# Patient Record
Sex: Male | Born: 1940 | Race: White | Hispanic: No | Smoking: Never smoker
Health system: Southern US, Community
[De-identification: ages and names within clinical notes are randomized; demographics above are authoritative.]

## PROBLEM LIST (undated history)

## (undated) DIAGNOSIS — I712 Thoracic aortic aneurysm, without rupture, unspecified: Secondary | ICD-10-CM

## (undated) DIAGNOSIS — E119 Type 2 diabetes mellitus without complications: Secondary | ICD-10-CM

## (undated) DIAGNOSIS — I493 Ventricular premature depolarization: Secondary | ICD-10-CM

## (undated) DIAGNOSIS — I739 Peripheral vascular disease, unspecified: Secondary | ICD-10-CM

## (undated) DIAGNOSIS — I2510 Atherosclerotic heart disease of native coronary artery without angina pectoris: Secondary | ICD-10-CM

## (undated) DIAGNOSIS — R600 Localized edema: Secondary | ICD-10-CM

## (undated) DIAGNOSIS — I10 Essential (primary) hypertension: Secondary | ICD-10-CM

## (undated) DIAGNOSIS — E785 Hyperlipidemia, unspecified: Secondary | ICD-10-CM

---

## 2000-12-08 HISTORY — PX: CARDIAC CATHETERIZATION: SHX172

## 2015-12-09 HISTORY — PX: CARDIAC CATHETERIZATION: SHX172

## 2021-03-09 ENCOUNTER — Observation Stay (HOSPITAL_COMMUNITY)
Admission: EM | Admit: 2021-03-09 | Discharge: 2021-03-10 | Disposition: A | Discharge status: Home / Self Care | Payer: Medicare HMO | Attending: Internal Medicine | Admitting: Internal Medicine

## 2021-03-09 ENCOUNTER — Encounter (HOSPITAL_COMMUNITY): Payer: Self-pay | Admitting: Physician Assistant

## 2021-03-09 ENCOUNTER — Emergency Department (HOSPITAL_COMMUNITY): Payer: Medicare HMO

## 2021-03-09 ENCOUNTER — Other Ambulatory Visit: Payer: Self-pay

## 2021-03-09 DIAGNOSIS — I2510 Atherosclerotic heart disease of native coronary artery without angina pectoris: Secondary | ICD-10-CM | POA: Diagnosis not present

## 2021-03-09 DIAGNOSIS — R6884 Jaw pain: Secondary | ICD-10-CM | POA: Insufficient documentation

## 2021-03-09 DIAGNOSIS — Z8673 Personal history of transient ischemic attack (TIA), and cerebral infarction without residual deficits: Secondary | ICD-10-CM | POA: Diagnosis not present

## 2021-03-09 DIAGNOSIS — E119 Type 2 diabetes mellitus without complications: Secondary | ICD-10-CM | POA: Insufficient documentation

## 2021-03-09 DIAGNOSIS — I10 Essential (primary) hypertension: Secondary | ICD-10-CM | POA: Insufficient documentation

## 2021-03-09 DIAGNOSIS — I739 Peripheral vascular disease, unspecified: Secondary | ICD-10-CM | POA: Diagnosis not present

## 2021-03-09 DIAGNOSIS — R42 Dizziness and giddiness: Secondary | ICD-10-CM | POA: Diagnosis not present

## 2021-03-09 DIAGNOSIS — R55 Syncope and collapse: Secondary | ICD-10-CM | POA: Insufficient documentation

## 2021-03-09 DIAGNOSIS — I959 Hypotension, unspecified: Secondary | ICD-10-CM | POA: Diagnosis not present

## 2021-03-09 DIAGNOSIS — I493 Ventricular premature depolarization: Secondary | ICD-10-CM | POA: Insufficient documentation

## 2021-03-09 DIAGNOSIS — M542 Cervicalgia: Secondary | ICD-10-CM | POA: Diagnosis not present

## 2021-03-09 DIAGNOSIS — E785 Hyperlipidemia, unspecified: Secondary | ICD-10-CM | POA: Insufficient documentation

## 2021-03-09 DIAGNOSIS — Z20822 Contact with and (suspected) exposure to covid-19: Secondary | ICD-10-CM | POA: Diagnosis not present

## 2021-03-09 DIAGNOSIS — R001 Bradycardia, unspecified: Secondary | ICD-10-CM

## 2021-03-09 HISTORY — DX: Thoracic aortic aneurysm, without rupture, unspecified: I71.20

## 2021-03-09 HISTORY — DX: Peripheral vascular disease, unspecified: I73.9

## 2021-03-09 HISTORY — DX: Localized edema: R60.0

## 2021-03-09 HISTORY — DX: Hyperlipidemia, unspecified: E78.5

## 2021-03-09 HISTORY — DX: Ventricular premature depolarization: I49.3

## 2021-03-09 HISTORY — DX: Essential (primary) hypertension: I10

## 2021-03-09 HISTORY — DX: Thoracic aortic aneurysm, without rupture: I71.2

## 2021-03-09 HISTORY — DX: Type 2 diabetes mellitus without complications: E11.9

## 2021-03-09 HISTORY — DX: Atherosclerotic heart disease of native coronary artery without angina pectoris: I25.10

## 2021-03-09 LAB — CBC
HCT: 37.1 % — ABNORMAL LOW (ref 39.0–52.0)
Hemoglobin: 12.1 g/dL — ABNORMAL LOW (ref 13.0–17.0)
MCH: 30.4 pg (ref 26.0–34.0)
MCHC: 32.6 g/dL (ref 30.0–36.0)
MCV: 93.2 fL (ref 80.0–100.0)
Platelets: 158 10*3/uL (ref 150–400)
RBC: 3.98 MIL/uL — ABNORMAL LOW (ref 4.22–5.81)
RDW: 13.5 % (ref 11.5–15.5)
WBC: 5.8 10*3/uL (ref 4.0–10.5)
nRBC: 0 % (ref 0.0–0.2)

## 2021-03-09 LAB — CBC WITH DIFFERENTIAL/PLATELET
Abs Immature Granulocytes: 0.03 10*3/uL (ref 0.00–0.07)
Basophils Absolute: 0 10*3/uL (ref 0.0–0.1)
Basophils Relative: 0 %
Eosinophils Absolute: 0 10*3/uL (ref 0.0–0.5)
Eosinophils Relative: 0 %
HCT: 38.4 % — ABNORMAL LOW (ref 39.0–52.0)
Hemoglobin: 12.3 g/dL — ABNORMAL LOW (ref 13.0–17.0)
Immature Granulocytes: 0 %
Lymphocytes Relative: 10 %
Lymphs Abs: 0.7 10*3/uL (ref 0.7–4.0)
MCH: 31 pg (ref 26.0–34.0)
MCHC: 32 g/dL (ref 30.0–36.0)
MCV: 96.7 fL (ref 80.0–100.0)
Monocytes Absolute: 0.4 10*3/uL (ref 0.1–1.0)
Monocytes Relative: 6 %
Neutro Abs: 5.6 10*3/uL (ref 1.7–7.7)
Neutrophils Relative %: 84 %
Platelets: 149 10*3/uL — ABNORMAL LOW (ref 150–400)
RBC: 3.97 MIL/uL — ABNORMAL LOW (ref 4.22–5.81)
RDW: 13.5 % (ref 11.5–15.5)
WBC: 6.8 10*3/uL (ref 4.0–10.5)
nRBC: 0 % (ref 0.0–0.2)

## 2021-03-09 LAB — URINALYSIS, ROUTINE W REFLEX MICROSCOPIC
Bacteria, UA: NONE SEEN
Bilirubin Urine: NEGATIVE
Glucose, UA: 50 mg/dL — AB
Hgb urine dipstick: NEGATIVE
Ketones, ur: NEGATIVE mg/dL
Leukocytes,Ua: NEGATIVE
Nitrite: NEGATIVE
Protein, ur: NEGATIVE mg/dL
Specific Gravity, Urine: 1.015 (ref 1.005–1.030)
pH: 5 (ref 5.0–8.0)

## 2021-03-09 LAB — COMPREHENSIVE METABOLIC PANEL
ALT: 23 U/L (ref 0–44)
AST: 22 U/L (ref 15–41)
Albumin: 3.7 g/dL (ref 3.5–5.0)
Alkaline Phosphatase: 45 U/L (ref 38–126)
Anion gap: 9 (ref 5–15)
BUN: 34 mg/dL — ABNORMAL HIGH (ref 8–23)
CO2: 23 mmol/L (ref 22–32)
Calcium: 8.8 mg/dL — ABNORMAL LOW (ref 8.9–10.3)
Chloride: 105 mmol/L (ref 98–111)
Creatinine, Ser: 1.35 mg/dL — ABNORMAL HIGH (ref 0.61–1.24)
GFR, Estimated: 53 mL/min — ABNORMAL LOW (ref >60)
Glucose, Bld: 141 mg/dL — ABNORMAL HIGH (ref 70–99)
Potassium: 4.7 mmol/L (ref 3.5–5.1)
Sodium: 137 mmol/L (ref 135–145)
Total Bilirubin: 1 mg/dL (ref 0.3–1.2)
Total Protein: 5.9 g/dL — ABNORMAL LOW (ref 6.5–8.1)

## 2021-03-09 LAB — TROPONIN I (HIGH SENSITIVITY)
Troponin I (High Sensitivity): 11 ng/L (ref <18)
Troponin I (High Sensitivity): 11 ng/L (ref <18)
Troponin I (High Sensitivity): 13 ng/L (ref <18)

## 2021-03-09 LAB — TSH: TSH: 4.147 u[IU]/mL (ref 0.350–4.500)

## 2021-03-09 LAB — MAGNESIUM: Magnesium: 2.1 mg/dL (ref 1.7–2.4)

## 2021-03-09 LAB — CREATININE, SERUM
Creatinine, Ser: 1.2 mg/dL (ref 0.61–1.24)
GFR, Estimated: >60 mL/min (ref >60)

## 2021-03-09 LAB — LACTIC ACID, PLASMA
Lactic Acid, Venous: 1.2 mmol/L (ref 0.5–1.9)
Lactic Acid, Venous: 1.2 mmol/L (ref 0.5–1.9)

## 2021-03-09 LAB — RESP PANEL BY RT-PCR (FLU A&B, COVID) ARPGX2
Influenza A by PCR: NEGATIVE
Influenza B by PCR: NEGATIVE
SARS Coronavirus 2 by RT PCR: NEGATIVE

## 2021-03-09 LAB — GLUCOSE, CAPILLARY: Glucose-Capillary: 115 mg/dL — ABNORMAL HIGH (ref 70–99)

## 2021-03-09 IMAGING — DX DG CHEST 1V PORT
2 series · 2 of 2 positions shown · non-contrast
Comparison: None.

CLINICAL DATA: Near-syncope

EXAM:
PORTABLE CHEST 1 VIEW

[chest ap (1 of 2)]
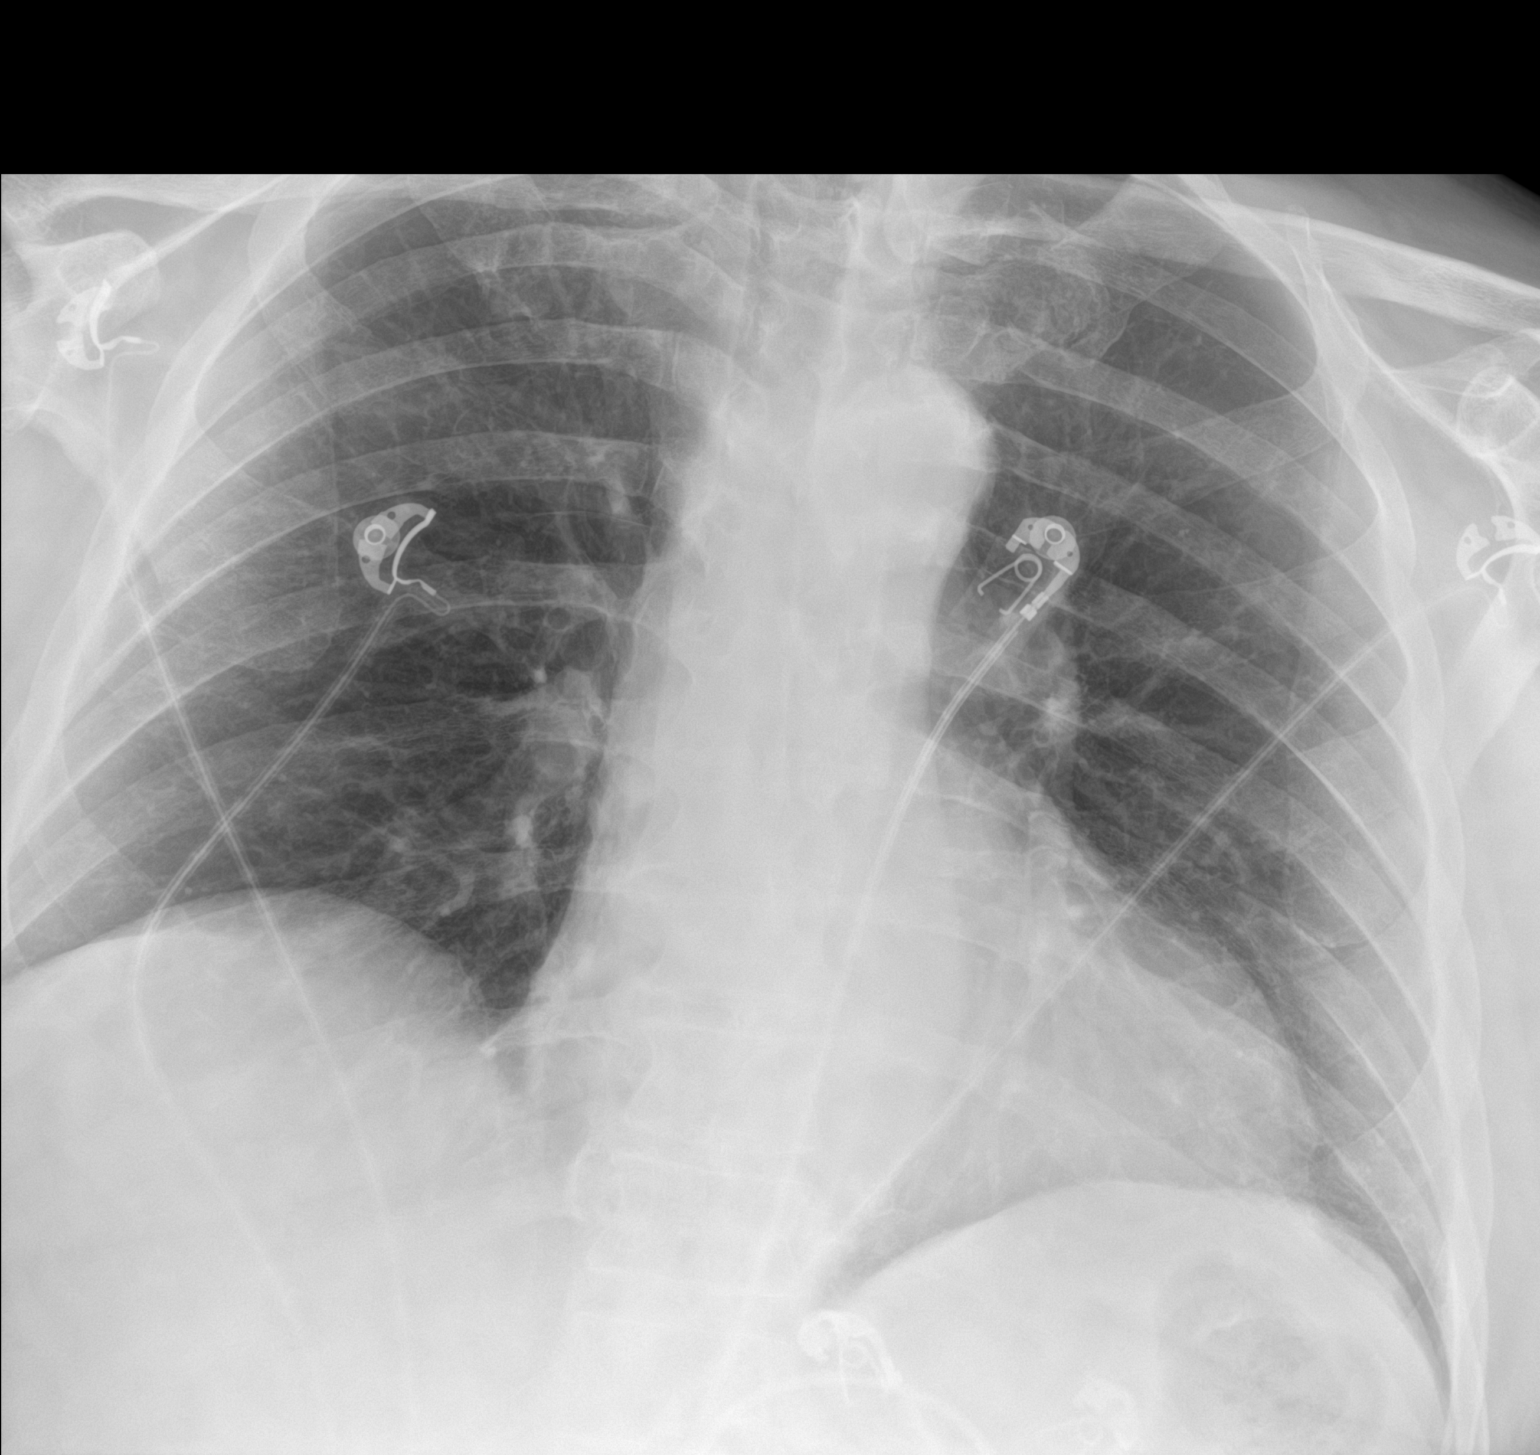

[chest ap (2 of 2)]
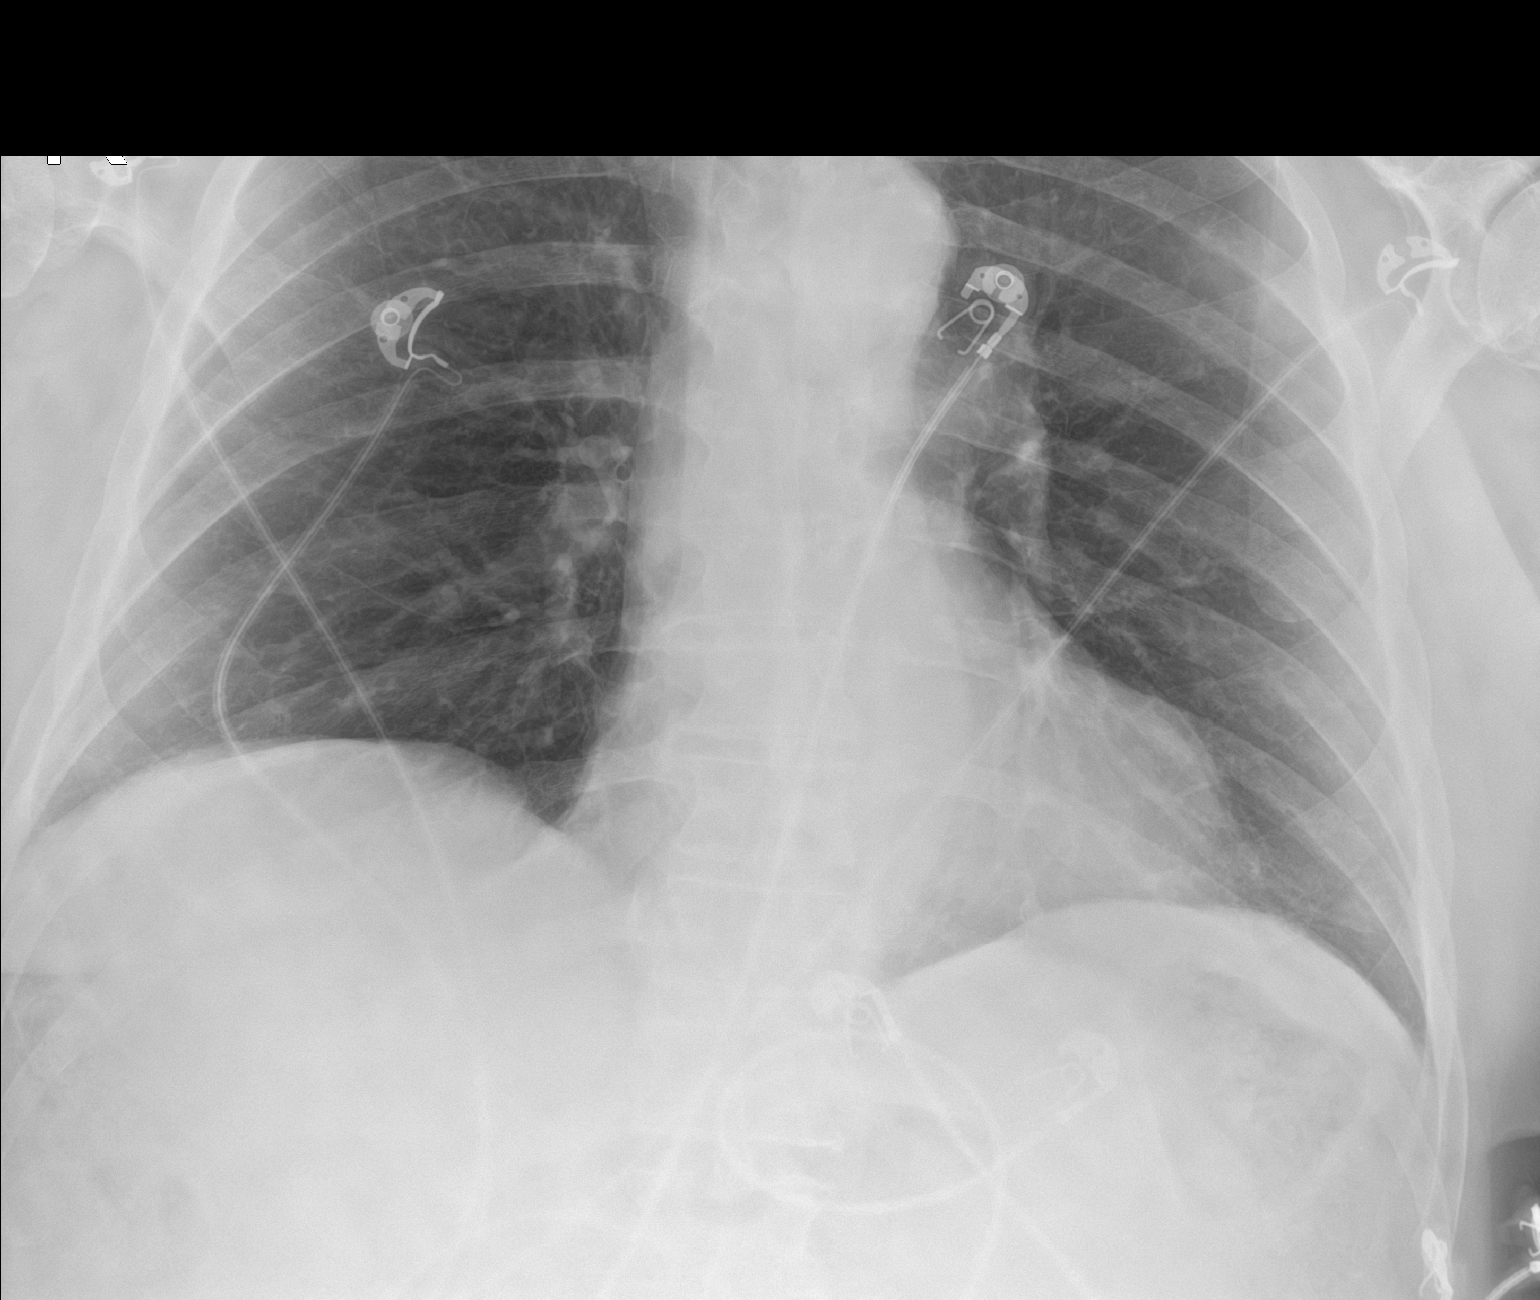

[2 of 2 positions shown; findings below may reference images not displayed]

FINDINGS: The heart, hila, and mediastinum are unremarkable. No pneumothorax.
No nodules or masses. No focal infiltrates or overt edema.
IMPRESSION: No active disease.

## 2021-03-09 MED ORDER — ONDANSETRON HCL 4 MG/2ML IJ SOLN: 4.0000 mg | Freq: Four times a day (QID) | INTRAMUSCULAR | Status: DC | PRN

## 2021-03-09 MED ORDER — ATORVASTATIN CALCIUM 10 MG PO TABS
20.0000 mg | ORAL_TABLET | Freq: Every day | ORAL | Status: DC
  Administered 2021-03-10: 20 mg via ORAL
  Filled 2021-03-09: qty 2

## 2021-03-09 MED ORDER — HEPARIN SODIUM (PORCINE) 5000 UNIT/ML IJ SOLN
5000.0000 [IU] | Freq: Three times a day (TID) | INTRAMUSCULAR | Status: DC
  Administered 2021-03-09 – 2021-03-10 (×2): 5000 [IU] via SUBCUTANEOUS
  Filled 2021-03-09 (×2): qty 1

## 2021-03-09 MED ORDER — CARVEDILOL 6.25 MG PO TABS
6.2500 mg | ORAL_TABLET | Freq: Two times a day (BID) | ORAL | Status: DC
  Administered 2021-03-10: 6.25 mg via ORAL
  Filled 2021-03-09 (×2): qty 1

## 2021-03-09 MED ORDER — ACETAMINOPHEN 325 MG PO TABS: 650.0000 mg | ORAL_TABLET | Freq: Four times a day (QID) | ORAL | Status: DC | PRN

## 2021-03-09 MED ORDER — SODIUM CHLORIDE 0.9 % IV SOLN: Freq: Once | INTRAVENOUS | Status: AC

## 2021-03-09 MED ORDER — MAGNESIUM OXIDE 400 (241.3 MG) MG PO TABS
200.0000 mg | ORAL_TABLET | Freq: Every day | ORAL | Status: DC
  Administered 2021-03-10: 200 mg via ORAL
  Filled 2021-03-09: qty 1
  Filled 2021-03-09: qty 0.5

## 2021-03-09 MED ORDER — CLOPIDOGREL BISULFATE 75 MG PO TABS: 75.0000 mg | ORAL_TABLET | ORAL | Status: DC

## 2021-03-09 MED ORDER — NITROGLYCERIN 0.4 MG SL SUBL: 0.4000 mg | SUBLINGUAL_TABLET | SUBLINGUAL | Status: DC | PRN

## 2021-03-09 MED ORDER — SODIUM CHLORIDE 0.9 % IV BOLUS
500.0000 mL | Freq: Once | INTRAVENOUS | Status: AC
  Administered 2021-03-09: 500 mL via INTRAVENOUS

## 2021-03-09 MED ORDER — ASPIRIN EC 81 MG PO TBEC
81.0000 mg | DELAYED_RELEASE_TABLET | Freq: Every day | ORAL | Status: DC
  Administered 2021-03-10: 81 mg via ORAL
  Filled 2021-03-09: qty 1

## 2021-03-09 NOTE — Progress Notes (Signed)
Noted home coreg ordered, paged to ask MD regarding giving medication.  Orders to hold heart rate less than 50.  SBP 120, HR 58-61, pt declined to take coreg and then noted heart rate to decrease to 45 nonsustained.

## 2021-03-09 NOTE — ED Notes (Signed)
SBP decreased to 80's.  Dr Jacqulyn Bath aware and at bedside.  No change in pt mentation.

## 2021-03-09 NOTE — ED Notes (Signed)
PT to CT.

## 2021-03-09 NOTE — ED Provider Notes (Signed)
Emergency Department Provider Note   I have reviewed the triage vital signs and the nursing notes.   HISTORY  Chief Complaint Dizziness   HPI Donald Fitzpatrick is a 80 y.o. male presents to the emergency department with EMS with complaint of dizziness and hypotension this morning.  Patient awoke and was feeling okay but began to feel very lightheaded with some pain in his neck and felt like he was going to pass out.  He denies any chest pain or arm discomfort.  No pain in his jaw.  He ultimately called EMS for evaluation and they report on scene he was very bradycardic with a heart rate in the 30s and noticed frequent PVCs and occasional runs of bigeminy.  He was hypotensive with systolic pressures in the 70s.  His color and perfusion were poor.  He was given 0.5 mg of atropine along with 800 mL IV fluids.  They report no change in rate with the atropine but patient's blood pressure and perfusion did improve significantly with IV fluids.  He reports feeling much better and notes that his dizziness and neck discomfort have resolved.  No prior history of bradycardia but does have history of CAD and stenting.  His cardiologist is reportedly at [facility]. No abdominal or back pain. No radiation of symptoms or modifying factors.    Past Medical History:  Diagnosis Date  . CAD (coronary artery disease)    h/o LAD stent in [place]  . DM II (diabetes mellitus, type II), controlled (HCC)   . Frequent PVCs   . Hyperlipidemia LDL goal <70   . Hypertension   . Leg edema, right    chronic mild LLE edema  . PAD (peripheral artery disease) (HCC)   . Thoracic aortic aneurysm (TAA) (HCC)    4cm on previous echo    There are no problems to display for this patient.  Allergies Patient has no known allergies.  Family History  Problem Relation Age of Onset  . Pancreatic cancer Mother   . Diabetes Mother   . Heart attack Father     Social History Social History   Tobacco Use  . Smoking  status: Never Smoker  . Smokeless tobacco: Never Used  Substance Use Topics  . Alcohol use: Yes    Comment: rarely  . Drug use: Never    Review of Systems  Constitutional: No fever/chills. Positive dizziness.  Eyes: No visual changes. ENT: No sore throat. Cardiovascular: Denies chest pain. Bradycardia and hypotension.  Respiratory: Denies shortness of breath. Gastrointestinal: No abdominal pain.  No nausea, no vomiting.  No diarrhea.  No constipation. Genitourinary: Negative for dysuria. Musculoskeletal: Negative for back pain. Positive neck pain (resolved).  Skin: Negative for rash. Neurological: Negative for headaches, focal weakness or numbness.  10-point ROS otherwise negative.  ____________________________________________   PHYSICAL EXAM:  VITAL SIGNS: Vitals:   03/09/21 1445 03/09/21 1500  BP: (!) 108/59 118/70  Pulse: 67 (!) 58  Resp: 16 19  Temp:    SpO2: 98% 97%    Constitutional: Alert and oriented. Well appearing and in no acute distress. Eyes: Conjunctivae are normal. Head: Atraumatic. Nose: No congestion/rhinnorhea. Mouth/Throat: Mucous membranes are moist.  Neck: No stridor.   Cardiovascular: Normal rate, regular rhythm. Good peripheral circulation. Grossly normal heart sounds.   Respiratory: Normal respiratory effort.  No retractions. Lungs CTAB. Gastrointestinal: Soft and nontender. No distention.  Musculoskeletal: No lower extremity tenderness nor edema. No gross deformities of extremities. Neurologic:  Normal speech and language.  No gross focal neurologic deficits are appreciated.  5/5 strength in bilateral upper and lower extremities.  Normal finger-to-nose testing bilaterally.  No facial asymmetry.  Skin:  Skin is warm, dry and intact. No rash noted.  ____________________________________________   LABS (all labs ordered are listed, but only abnormal results are displayed)  Labs Reviewed  URINALYSIS, ROUTINE W REFLEX MICROSCOPIC - Abnormal;  Notable for the following components:      Result Value   APPearance HAZY (*)    Glucose, UA 50 (*)    All other components within normal limits  COMPREHENSIVE METABOLIC PANEL - Abnormal; Notable for the following components:   Glucose, Bld 141 (*)    BUN 34 (*)    Creatinine, Ser 1.35 (*)    Calcium 8.8 (*)    Total Protein 5.9 (*)    GFR, Estimated 53 (*)    All other components within normal limits  CBC WITH DIFFERENTIAL/PLATELET - Abnormal; Notable for the following components:   RBC 3.97 (*)    Hemoglobin 12.3 (*)    HCT 38.4 (*)    Platelets 149 (*)    All other components within normal limits  RESP PANEL BY RT-PCR (FLU A&B, COVID) ARPGX2  MAGNESIUM  TSH  CBC WITH DIFFERENTIAL/PLATELET  LACTIC ACID, PLASMA  LACTIC ACID, PLASMA  TROPONIN I (HIGH SENSITIVITY)  TROPONIN I (HIGH SENSITIVITY)   ____________________________________________  EKG   EKG Interpretation  Date/Time:  Saturday March 09 2021 09:11:10 EDT Ventricular Rate:  57 PR Interval:  206 QRS Duration: 104 QT Interval:  439 QTC Calculation: 428 R Axis:   -16 Text Interpretation: Sinus rhythm Inferolateral infarct, old Baseline wander in lead(s) V6 No old tracing for comparison Confirmed by Alona Bene [phone]) on 03/09/2021 10:26:22 AM       ____________________________________________  RADIOLOGY  CT Head Wo Contrast  Result Date: 03/09/2021 CLINICAL DATA:  Mental status change EXAM: CT HEAD WITHOUT CONTRAST TECHNIQUE: Contiguous axial images were obtained from the base of the skull through the vertex without intravenous contrast. COMPARISON:  None FINDINGS: Brain: No evidence of acute large vascular territory infarction, hemorrhage, hydrocephalus, extra-axial collection or mass lesion/mass effect. Mild patchy white matter hypoattenuation, nonspecific but most likely related to chronic microvascular ischemic disease. Vascular: No hyperdense vessel identified. Calcific atherosclerosis. Skull: No acute  fracture. Sinuses/Orbits: Mild ethmoid air cell mucosal thickening. No air-fluid levels. Unremarkable orbits. Other: No mastoid effusions. IMPRESSION: No evidence of acute intracranial abnormality. Electronically Signed   By: Feliberto Harts MD   On: 03/09/2021 11:03   DG Chest Portable 1 View  Result Date: 03/09/2021 CLINICAL DATA:  Near-syncope EXAM: PORTABLE CHEST 1 VIEW COMPARISON:  None. FINDINGS: The heart, hila, and mediastinum are unremarkable. No pneumothorax. No nodules or masses. No focal infiltrates or overt edema. IMPRESSION: No active disease. Electronically Signed   By: Gerome Sam III M.D   On: 03/09/2021 09:36    ____________________________________________   PROCEDURES  Procedure(s) performed:   Procedures  CRITICAL CARE Performed by: Maia Plan Total critical care time: 35 minutes Critical care time was exclusive of separately billable procedures and treating other patients. Critical care was necessary to treat or prevent imminent or life-threatening deterioration. Critical care was time spent personally by me on the following activities: development of treatment plan with patient and/or surrogate as well as nursing, discussions with consultants, evaluation of patient's response to treatment, examination of patient, obtaining history from patient or surrogate, ordering and performing treatments and interventions, ordering and review of  laboratory studies, ordering and review of radiographic studies, pulse oximetry and re-evaluation of patient's condition.  Alona Bene, MD Emergency Medicine  ____________________________________________   INITIAL IMPRESSION / ASSESSMENT AND PLAN / ED COURSE  Pertinent labs & imaging results that were available during my care of the patient were reviewed by me and considered in my medical decision making (see chart for details).   Patient presents to the emergency department with hypotension and tachycardia on scene with EMS.   The EMS tracings are at bedside and shows some occasional bigeminy.  The paramedic was not able to capture the rate in the 30s which was present when the ambulance initially arrived.  He seems to have improved with IV fluids.  I do not have records in our [facility] system and will look in Care Everywhere for any [facility] records that might be available regarding the patient's history.  EKG here does not show significant bradycardia or acute ischemic change.  Labs including TSH and magnesium, troponin are pending.  Will obtain CT imaging of the head but suspect patient's dizziness was not primarily a neurologic issue.   Labs reviewed.  And potassium and magnesium are normal.  TSH is normal.  Troponin is not elevated.  CT imaging of the head shows no acute findings.  Chest x-ray is clear.  Patient remains with bradycardia and occasional runs of bigeminy and frequent PVCs.  His blood pressures are responsive to fluids but occasionally hypotensive.  His mental status has not changed in his hypotensive episodes.  Have consulted cardiology.   Discussed patient's case with Cardiology to request admission. Patient and family (if present) updated with plan. Care transferred to Cardiology service.  I reviewed all nursing notes, vitals, pertinent old records, EKGs, labs, imaging (as available).  ____________________________________________  FINAL CLINICAL IMPRESSION(S) / ED DIAGNOSES  Final diagnoses:  Dizziness  Bradycardia  Hypotension, unspecified hypotension type     MEDICATIONS GIVEN DURING THIS VISIT:  Medications  0.9 %  sodium chloride infusion ( Intravenous New Bag/Given 03/09/21 0932)  sodium chloride 0.9 % bolus 500 mL (0 mLs Intravenous Stopped 03/09/21 1308)  sodium chloride 0.9 % bolus 500 mL (500 mLs Intravenous New Bag/Given 03/09/21 1309)    Note:  This document was prepared using Dragon voice recognition software and may include unintentional dictation errors.  Alona Bene, MD,  [facility] Emergency Medicine    Dayln Tugwell, Arlyss Repress, MD 03/09/21 [phone]

## 2021-03-09 NOTE — ED Notes (Signed)
Dr. Klein at bedside 

## 2021-03-09 NOTE — H&P (Addendum)
Cardiology Admission History and Physical:   Patient ID: Donald Fitzpatrick MRN: [id number]; DOB: Nov 28, 1941   Admission date: 03/09/2021  PCP:  Larkin Ina   [facility]  Cardiologist:  Dr. Wille Glaser of [facility] cardiology Advanced Practice Provider:  No care team member to display Electrophysiologist:  None   Chief Complaint:  dizziness  Patient Profile:   Donald Fitzpatrick is a 80 y.o. male with HTN, HLD, DM 2, CAD s/p LAD stent 2002, TIA, PAD, frequent PVCs and chronic mild right lower extremity edema presented with transient AMS and presyncope with hypotension this morning.   History of Present Illness:   Donald Fitzpatrick is a 80 year old male was past medical history of HTN, HLD, DM 2, CAD s/p LAD stent 2002, TIA, PAD, frequent PVCs and chronic mild right lower extremity edema.  Donald Fitzpatrick did have abnormal stress test however follow-up cardiac catheterization December 2017 showed patent stent with nonobstructive disease.  Donald Fitzpatrick had AKI with dark urine after Donald Fitzpatrick Ranexa was increased to 1000 mg twice a day last year, Donald Fitzpatrick responded to discontinuation of Ranexa.  Donald Fitzpatrick is on carvedilol given history of coronary artery disease and frequent PVCs.  Previous EKG has show frequent PVCs with bigeminy pattern as well.  Holter monitor in 2020 demonstrated sinus bradycardia 52% of the time was slowest heart rate 48 bpm, average heart rate of 63, maximum heart rate of 140.  Donald Fitzpatrick was seen by Dr. Chales Abrahams of the electrophysiology service who recommended continue medical management unless Donald Fitzpatrick develop any syncope or worsening symptoms.  Echocardiogram in May 2021 showed borderline low EF of 50%, grade 3 DD, no significant valve disease noted, no pulmonary hypertension. Myoview in June 2021 showed inferior and inferolateral scar similar to the previous Myoview in December 2017, no evidence of ischemia.  Low extremity Doppler in August 2021 showed no evidence of DVT in the right lower extremity, Donald Fitzpatrick did  have significant reflux in the right GSV.  More recently, Donald Fitzpatrick has been seen by Donald Fitzpatrick primary cardiologist on 01/07/2019 at which time Donald Fitzpatrick was doing well.  A repeat Holter monitor was done to assess Donald Fitzpatrick PVC burden, PVC represented 5.8% of total beats.  Donald Fitzpatrick did not have significant pauses or prolonged ventricular ectopy.  Patient was in Donald Fitzpatrick usual state of health until around 7 AM in the morning of 03/09/2021.  While making coffee in the kitchen, Donald Fitzpatrick developed sudden onset of dizziness, jaw pain, neck pain, weakness and blurred vision.  Donald Fitzpatrick does not have any chronic neck pain issue.  Donald Fitzpatrick also had a transient altered mental status, Donald Fitzpatrick allowed Donald Fitzpatrick coffeepot to overflow without noticing or remembering the event.  Donald Fitzpatrick says the symptom is new and is not similar to the previous angina where Donald Fitzpatrick had true chest pain. Donald Fitzpatrick denies any dizziness when Donald Fitzpatrick got up this morning. Donald Fitzpatrick did not have any flushing sensation either. Donald Fitzpatrick denies any recent exertional chest discomfort or shortness of breath.  Donald Fitzpatrick subsequently called EMS.  On EMS arrival, Donald Fitzpatrick blood pressure was 76/42.  Heart rate was noted to be bradycardic.  Patient appears to be clammy and pale.  After initial IV fluid bolus, symptoms largely resolved.  Per report, Donald Fitzpatrick was also given a dose of atropine as well which improved Donald Fitzpatrick blood pressure however did not help with the heart rate.  Initial EKG obtained by EMS showed a heart rate in the 60s with occasional PVCs.  Since ED arrival, patient continued to have sinus rhythm with frequent PVCs  and bigeminy.  Cardiology service has been consulted for near syncope.   Past Medical History:  Diagnosis Date  . CAD (coronary artery disease)    h/o LAD stent in [place]  . DM II (diabetes mellitus, type II), controlled (HCC)   . Frequent PVCs   . Hyperlipidemia LDL goal <70   . Hypertension   . Leg edema, right    chronic mild LLE edema  . PAD (peripheral artery disease) (HCC)   . Thoracic aortic aneurysm (TAA) (HCC)    4cm on previous  echo    Past Surgical History:  Procedure Laterality Date  . CARDIAC CATHETERIZATION  2002   acute MI in 2002, cath done in [place], LAD stent placed  . CARDIAC CATHETERIZATION  2017   done after abnormal myoview, patent stent, non obstructive disease     Medications Prior to Admission: Prior to Admission medications   Not on File     Allergies:   No Known Allergies  Social History:   Social History   Socioeconomic History  . Marital status: Unknown    Spouse name: Not on file  . Number of children: Not on file  . Years of education: Not on file  . Highest education level: Not on file  Occupational History  . Not on file  Tobacco Use  . Smoking status: Never Smoker  . Smokeless tobacco: Never Used  Substance and Sexual Activity  . Alcohol use: Yes    Comment: rarely  . Drug use: Never  . Sexual activity: Not on file  Other Topics Concern  . Not on file  Social History Narrative  . Not on file   Social Determinants of Health   Financial Resource Strain: Not on file  Food Insecurity: Not on file  Transportation Needs: Not on file  Physical Activity: Not on file  Stress: Not on file  Social Connections: Not on file  Intimate Partner Violence: Not on file    Family History:   The patient's family history includes Diabetes in Donald Fitzpatrick mother; Heart attack in Donald Fitzpatrick father; Pancreatic cancer in Donald Fitzpatrick mother.    ROS:  Please see the history of present illness.  All other ROS reviewed and negative.     Physical Exam/Data:   Vitals:   03/09/21 1345 03/09/21 1415 03/09/21 1445 03/09/21 1500  BP: (!) 126/55 (!) 110/58 (!) 108/59 118/70  Pulse: (!) 29 (!) 28 67 (!) 58  Resp: 20 12 16 19   Temp:      TempSrc:      SpO2: 97% 94% 98% 97%    Intake/Output Summary (Last 24 hours) at 03/09/2021 1532 Last data filed at 03/09/2021 1308 Gross per 24 hour  Intake 500 ml  Output --  Net 500 ml   No flowsheet data found.   There is no height or weight on file to calculate  BMI.  General:  Well nourished, well developed, in no acute distress HEENT: normal Lymph: no adenopathy Neck: no JVD Endocrine:  No thryomegaly Vascular: No carotid bruits; FA pulses 2+ bilaterally without bruits  Cardiac:  normal S1, S2; RRR; no murmur  Lungs:  clear to auscultation bilaterally, no wheezing, rhonchi or rales  Abd: soft, nontender, no hepatomegaly  Ext: no edema Musculoskeletal:  No deformities, BUE and BLE strength normal and equal Skin: warm and dry  Neuro:  CNs 2-12 intact, no focal abnormalities noted Psych:  Normal affect    EKG:  The ECG that was done and was personally  reviewed and demonstrates sinus rhythm with PVCs.  Relevant CV Studies:  Echo 04/12/2020 Aorta: The aortic root is at upper limit of normal in size. Proximal  ascending aorta size is at upper limit of normal-3.6 cm.  . Mitral Valve: There is mild regurgitation with a posteriorly directed  jet.  . Left Atrium: Left atrium is moderately dilated.  . Left Ventricle: There is moderate concentric hypertrophy. Systolic  function is low normal to borderline reduced. EF: 50%. Wall motion is low  normal to borderline reduced. Doppler parameters consistent with moderate  diastolic dysfunctionand elevated LA pressure.  . Right Ventricle: There is mild hypertrophy.  . Tricuspid Valve: The right ventricular systolic pressure is normal (<36  mmHg).    Laboratory Data:  High Sensitivity Troponin:   Recent Labs  Lab 03/09/21 1142  TROPONINIHS 11      Chemistry Recent Labs  Lab 03/09/21 1142  NA 137  K 4.7  CL 105  CO2 23  GLUCOSE 141*  BUN 34*  CREATININE 1.35*  CALCIUM 8.8*  GFRNONAA 53*  ANIONGAP 9    Recent Labs  Lab 03/09/21 1142  PROT 5.9*  ALBUMIN 3.7  AST 22  ALT 23  ALKPHOS 45  BILITOT 1.0   Hematology Recent Labs  Lab 03/09/21 1142  WBC 6.8  RBC 3.97*  HGB 12.3*  HCT 38.4*  MCV 96.7  MCH 31.0  MCHC 32.0  RDW 13.5  PLT 149*   BNPNo results for  input(s): BNP, PROBNP in the last 168 hours.  DDimer No results for input(s): DDIMER in the last 168 hours.   Radiology/Studies:  CT Head Wo Contrast  Result Date: 03/09/2021 CLINICAL DATA:  Mental status change EXAM: CT HEAD WITHOUT CONTRAST TECHNIQUE: Contiguous axial images were obtained from the base of the skull through the vertex without intravenous contrast. COMPARISON:  None FINDINGS: Brain: No evidence of acute large vascular territory infarction, hemorrhage, hydrocephalus, extra-axial collection or mass lesion/mass effect. Mild patchy white matter hypoattenuation, nonspecific but most likely related to chronic microvascular ischemic disease. Vascular: No hyperdense vessel identified. Calcific atherosclerosis. Skull: No acute fracture. Sinuses/Orbits: Mild ethmoid air cell mucosal thickening. No air-fluid levels. Unremarkable orbits. Other: No mastoid effusions. IMPRESSION: No evidence of acute intracranial abnormality. Electronically Signed   By: Feliberto Harts MD   On: 03/09/2021 11:03   DG Chest Portable 1 View  Result Date: 03/09/2021 CLINICAL DATA:  Near-syncope EXAM: PORTABLE CHEST 1 VIEW COMPARISON:  None. FINDINGS: The heart, hila, and mediastinum are unremarkable. No pneumothorax. No nodules or masses. No focal infiltrates or overt edema. IMPRESSION: No active disease. Electronically Signed   By: Gerome Sam III M.D   On: 03/09/2021 09:36     Assessment and Plan:   1. Near syncope:  -Per report, patient had a near syncope around 7 AM this morning while making coffee.  This is accompanied by severe hypotension and bradycardia per EMS report.  Unfortunately I am unable to locate the EMS strip that shows the bradycardia.  All the EMS strip documenting our system shows the heart rate was in normal range.  Blood pressure quickly comes back after a dose of atropine and IV fluid.  Currently blood pressure still borderline.  -Donald Fitzpatrick is on carvedilol and losartan at home.   -We will  admit to cardiology service and observe overnight to see if there is any further bradycardic episode.  Per patient, Donald Fitzpatrick just wore a Holter monitor in February 2022 that showed minimal heart rate 47, maximum  heart rate 87, average heart rate 61.  PVC accounted for 5.8% of total beat, 231 couplet of PVC noted representing 0.1% total beats.  Bigeminy pattern represented 1.9% of total beats.  No significant ventricular ectopy, no pauses longer than 3 seconds.  -Symptom preceded by jaw pain in neck pain, question if this represent a form of vasovagal episode.  Will discuss with MD.   2. Possible bradycardia: Per EMS report, patient's heart rate went down to the 30s, however EMS strip documented in epic system does not show significant bradycardia  3. CAD s/p LAD stent 2002: Denies any anginal symptoms.  History of AKI with high-dose Ranexa.  Last Myoview in 2021 showed no significant ischemia  4. Hypertension: Hold losartan.  Also on carvedilol at home, will discuss with MD regarding whether to hold carvedilol as well  5. Hyperlipidemia  6. DM2  7. PAD: patient has a history of abnormal ABI in 2012.  No significant claudication  8. Frequent PVCs: On carvedilol 6.25 mg twice a day.   Risk Assessment/Risk Scores:             Severity of Illness: The appropriate patient status for this patient is OBSERVATION. Observation status is judged to be reasonable and necessary in order to provide the required intensity of service to ensure the patient's safety. The patient's presenting symptoms, physical exam findings, and initial radiographic and laboratory data in the context of their medical condition is felt to place them at decreased risk for further clinical deterioration. Furthermore, it is anticipated that the patient will be medically stable for discharge from the hospital within 2 midnights of admission. The following factors support the patient status of observation.   " The patient's presenting  symptoms include pre-syncope. " The physical exam findings include benign. " The initial radiographic and laboratory data are frequent PVCs.     For questions or updates, please contact [facility] Please consult [email or web address] for contact info under    Signed, Sherryl Manges, MD  03/09/2021 3:32 PM    History is well outlined above.  Physical examination also as above.  Acute jaw and neck pain  Confusion associated with the above  Hypotension  PVCs-frequent with sinus bradycardia and functional bradycardia  Coronary artery disease with remote LAD stenting  TIA-previous   The patient presents having had 2 episodes of confusion and weakness today.  The first was associated with jaw and neck pain that persisted for 10-15 minutes.  Resolved concurrent with sugar repletion given Donald Fitzpatrick history of diabetes.  Felt again quite normal and then after breakfast again was weak and had to put Donald Fitzpatrick head down.  Less confused.  On arrival of first responders Donald Fitzpatrick blood pressure was in the 70s.  Donald Fitzpatrick was "bradycardic "but Donald Fitzpatrick has a history of functional bradycardia with frequent PVCs.  On arrival of EMS, rhythm strips did not demonstrate bradycardia but intermittently frequent PVCs.  I am puzzled by the presentation.  The low blood pressure in the context of the neck discomfort might well have been a neurally mediated reflex as it had accompanying symptoms of nausea, diaphoresis and pallor.  The acute associated confusion is disturbing, although Donald Fitzpatrick wife reports that there is been a great deal of stress with an impending move and there have been multiple conversations that Donald Fitzpatrick has not been able to follow and so the confusion was not totally surprising still the acute nature of it is concerning for not neurological process.  In this regard the CT was  normal.  Wonder whether Donald Fitzpatrick would benefit from neurological evaluation and MRI  .  I discussed with neurology, they thought it unlikely to be a TIA.  Raised the  possibility about orthostatic hypotension which is I think a good idea since Donald Fitzpatrick was standing as an explanation for acute confusion.  Jaw and neck discomfort that is self terminating suggest angina.  We will get a Myoview scan.  And self terminating nature suggested its not tearing of a great vessel.  If the acute evaluation is unenlightening, would discharge and have him follow-up with Donald Fitzpatrick primary cardiologist

## 2021-03-09 NOTE — ED Triage Notes (Signed)
Pt came in via [facility] from home. Pt woke up feeling dizzy, weak and blurred vision. Pt also states c/o neck pain.

## 2021-03-10 ENCOUNTER — Observation Stay (HOSPITAL_COMMUNITY): Payer: Medicare HMO

## 2021-03-10 DIAGNOSIS — R42 Dizziness and giddiness: Secondary | ICD-10-CM

## 2021-03-10 DIAGNOSIS — I10 Essential (primary) hypertension: Secondary | ICD-10-CM

## 2021-03-10 DIAGNOSIS — I493 Ventricular premature depolarization: Secondary | ICD-10-CM | POA: Diagnosis not present

## 2021-03-10 DIAGNOSIS — I2510 Atherosclerotic heart disease of native coronary artery without angina pectoris: Secondary | ICD-10-CM | POA: Diagnosis not present

## 2021-03-10 DIAGNOSIS — Z20822 Contact with and (suspected) exposure to covid-19: Secondary | ICD-10-CM | POA: Diagnosis not present

## 2021-03-10 DIAGNOSIS — R079 Chest pain, unspecified: Secondary | ICD-10-CM

## 2021-03-10 LAB — NM MYOCAR MULTI W/SPECT W/WALL MOTION / EF
Estimated workload: 1 METS
MPHR: 140 {beats}/min
Peak HR: 68 {beats}/min
Percent HR: 48 %
Rest HR: 56 {beats}/min

## 2021-03-10 LAB — BASIC METABOLIC PANEL
Anion gap: 5 (ref 5–15)
BUN: 27 mg/dL — ABNORMAL HIGH (ref 8–23)
CO2: 29 mmol/L (ref 22–32)
Calcium: 8.5 mg/dL — ABNORMAL LOW (ref 8.9–10.3)
Chloride: 106 mmol/L (ref 98–111)
Creatinine, Ser: 1.18 mg/dL (ref 0.61–1.24)
GFR, Estimated: >60 mL/min (ref >60)
Glucose, Bld: 108 mg/dL — ABNORMAL HIGH (ref 70–99)
Potassium: 3.9 mmol/L (ref 3.5–5.1)
Sodium: 140 mmol/L (ref 135–145)

## 2021-03-10 LAB — GLUCOSE, CAPILLARY
Glucose-Capillary: 122 mg/dL — ABNORMAL HIGH (ref 70–99)
Glucose-Capillary: 168 mg/dL — ABNORMAL HIGH (ref 70–99)

## 2021-03-10 LAB — TROPONIN I (HIGH SENSITIVITY): Troponin I (High Sensitivity): 14 ng/L (ref <18)

## 2021-03-10 MED ORDER — AMINOPHYLLINE 25 MG/ML IV SOLN
INTRAVENOUS | Status: AC
  Filled 2021-03-10: qty 10

## 2021-03-10 MED ORDER — ATROPINE SULFATE 1 MG/10ML IJ SOSY
PREFILLED_SYRINGE | INTRAMUSCULAR | Status: AC
  Filled 2021-03-10: qty 10

## 2021-03-10 MED ORDER — TECHNETIUM TC 99M TETROFOSMIN IV KIT
30.0000 | PACK | Freq: Once | INTRAVENOUS | Status: AC | PRN
  Administered 2021-03-10: 30 via INTRAVENOUS

## 2021-03-10 MED ORDER — REGADENOSON 0.4 MG/5ML IV SOLN
0.4000 mg | Freq: Once | INTRAVENOUS | Status: AC
  Filled 2021-03-10: qty 5

## 2021-03-10 MED ORDER — TECHNETIUM TC 99M TETROFOSMIN IV KIT
10.0000 | PACK | Freq: Once | INTRAVENOUS | Status: AC | PRN
  Administered 2021-03-10: 10 via INTRAVENOUS

## 2021-03-10 MED ORDER — REGADENOSON 0.4 MG/5ML IV SOLN
INTRAVENOUS | Status: AC
  Administered 2021-03-10: 0.4 mg via INTRAVENOUS
  Filled 2021-03-10: qty 5

## 2021-03-10 NOTE — Progress Notes (Signed)
Patient tolerated Lexiscan procedure stress test well without significant complication.  Pending final result by [facility] radiology this afternoon.

## 2021-03-10 NOTE — Plan of Care (Signed)

## 2021-03-10 NOTE — Discharge Summary (Signed)
Discharge Summary    Patient ID: Donald Fitzpatrick MRN: [id number]; DOB: 01-Jun-1941  Admit date: 03/09/2021 Discharge date: 03/10/2021  PCP:  Larkin Ina   [facility]  Cardiologist: [facility] - Dr. Houston Siren  Discharge Diagnoses    Principal Problem:   Dizziness Active Problems:   CAD (coronary artery disease)   Essential hypertension   PVC's (premature ventricular contractions)  Diagnostic Studies/Procedures    NST: 03/10/2021 IMPRESSION: 1. There is a large fixed defect in the inferior wall without wall thickening in this region consistent with a large inferior wall infarct. No reversible defects to suggest ischemia.  2. Global hypokinesis.  No thickening in the inferior wall.  3. Left ventricular ejection fraction 7%  4. Non invasive risk stratification*: High    History of Present Illness     Donald Fitzpatrick is a 80 y.o. male with past medical history of HTN, HLD, Type 2 DM, CAD (s/p LAD stent in 2002, patent stent by catheterization in 11/2016), TIA, PAD, frequent PVC's and chronic lower extremity edema who presented to [facility] ED on 03/09/2021 for evaluation of altered mental status and presyncope.   He reported being in his usual state of health until the morning of admission and while making coffee in the kitchen, he developed sudden onset of dizziness, jaw pain, neck pain, weakness and blurred vision. He also had a transient episode of altered mental status as he allowed his coffeepot to overflow without noticing or remembering the event. Symptoms were not similar to his prior angina. Upon EMS arrival, his blood pressure was 76/42 and he was reportedly bradycardiac. After initial IV fluid bolus, symptoms largely resolved. Per report, he was also given a dose of Atropine as well which improved his blood pressure but did not help with the heart rate. Initial EKG obtained by EMS showed a heart rate in the 60's with occasional  PVC's. Since ED arrival, patient continued to have sinus rhythm with frequent PVC's and bigeminy. Electrolytes and TSH were within a normal range. Hs Troponin values were negative. Symptoms were thought to likely be due to orthostatic hypotension. Symptoms were reviewed with Neurology as well but they did not feel symptoms were consistent with a TIA. Given his reported neck discomfort, he was admitted for further observation with plans for a stress test the following morning.   Hospital Course     Consultants: None   The following morning, he denied any chest pain or dyspnea. Felt back to baseline.Telemetry showed NSR with PVC's. His stress test was performed and showed a fixed defect in the inferior wall without wall thickening in this region consistent with a large inferior wall infarct but no reversible defects to suggest ischemia. EF was read as 7% but was reviewed with MD and felt to be inaccurate due to technical issues with the read. Reviewed with Dr. Jacques Navy and was recommended to consider a limited echocardiogram as an outpatient for complete clarification (EF was 50% by echo in 04/2020). He was last examined by Dr. Graciela Husbands and deemed stable for discharge. Was informed to follow-up with his Primary Cardiologist at [facility] within the next 2 weeks.   _____________  Discharge Vitals Blood pressure (!) 128/48, pulse 65, temperature 97.9 F (36.6 C), temperature source Oral, resp. rate 16, height 6\' 3"  (1.905 m), weight 102.5 kg, SpO2 99 %.  Filed Weights   03/09/21 2041 03/10/21 0411  Weight: 104.1 kg 102.5 kg    Labs & Radiologic Studies  CBC Recent Labs    03/09/21 1142 03/09/21 2048  WBC 6.8 5.8  NEUTROABS 5.6  --   HGB 12.3* 12.1*  HCT 38.4* 37.1*  MCV 96.7 93.2  PLT 149* 158   Basic Metabolic Panel Recent Labs    99/24/26 1142 03/09/21 2048 03/09/21 2317  NA 137  --  140  K 4.7  --  3.9  CL 105  --  106  CO2 23  --  29  GLUCOSE 141*  --  108*  BUN 34*  --  27*   CREATININE 1.35* 1.20 1.18  CALCIUM 8.8*  --  8.5*  MG 2.1  --   --    Liver Function Tests Recent Labs    03/09/21 1142  AST 22  ALT 23  ALKPHOS 45  BILITOT 1.0  PROT 5.9*  ALBUMIN 3.7   No results for input(s): LIPASE, AMYLASE in the last 72 hours. High Sensitivity Troponin:   Recent Labs  Lab 03/09/21 1142 03/09/21 1820 03/09/21 2048 03/09/21 2317  TROPONINIHS 11 11 13 14     BNP Invalid input(s): POCBNP D-Dimer No results for input(s): DDIMER in the last 72 hours. Hemoglobin A1C No results for input(s): HGBA1C in the last 72 hours. Fasting Lipid Panel No results for input(s): CHOL, HDL, LDLCALC, TRIG, CHOLHDL, LDLDIRECT in the last 72 hours. Thyroid Function Tests Recent Labs    03/09/21 1142  TSH 4.147   _____________  CT Head Wo Contrast  Result Date: 03/09/2021 CLINICAL DATA:  Mental status change EXAM: CT HEAD WITHOUT CONTRAST TECHNIQUE: Contiguous axial images were obtained from the base of the skull through the vertex without intravenous contrast. COMPARISON:  None FINDINGS: Brain: No evidence of acute large vascular territory infarction, hemorrhage, hydrocephalus, extra-axial collection or mass lesion/mass effect. Mild patchy white matter hypoattenuation, nonspecific but most likely related to chronic microvascular ischemic disease. Vascular: No hyperdense vessel identified. Calcific atherosclerosis. Skull: No acute fracture. Sinuses/Orbits: Mild ethmoid air cell mucosal thickening. No air-fluid levels. Unremarkable orbits. Other: No mastoid effusions. IMPRESSION: No evidence of acute intracranial abnormality. Electronically Signed   By: 05/09/2021 MD   On: 03/09/2021 11:03   DG Chest Portable 1 View  Result Date: 03/09/2021 CLINICAL DATA:  Near-syncope EXAM: PORTABLE CHEST 1 VIEW COMPARISON:  None. FINDINGS: The heart, hila, and mediastinum are unremarkable. No pneumothorax. No nodules or masses. No focal infiltrates or overt edema. IMPRESSION: No  active disease. Electronically Signed   By: 05/09/2021 III M.D   On: 03/09/2021 09:36   Disposition   Pt is being discharged home today in good condition.  Follow-up Plans & Appointments     Follow-up Information    05/09/2021, MD. Schedule an appointment as soon as possible for a visit in 2 week(s).   Specialty: Cardiology Contact information: [address] [place] [phone]                Discharge Medications   Allergies as of 03/10/2021   No Known Allergies     Medication List    TAKE these medications   acetaminophen 325 MG tablet Commonly known as: TYLENOL Take 650 mg by mouth every 6 (six) hours as needed for mild pain, fever or headache.   aspirin 81 MG EC tablet Take 81 mg by mouth daily.   atorvastatin 20 MG tablet Commonly known as: LIPITOR Take 1 tablet by mouth daily.   carvedilol 6.25 MG tablet Commonly known as: COREG Take 6.25 mg  by mouth 2 (two) times daily.   Centrum Silver tablet Take 1 tablet by mouth daily.   PRESERVISION AREDS 2 PO Take 1 tablet by mouth daily.   clopidogrel 75 MG tablet Commonly known as: PLAVIX Take 75 mg by mouth every other day.   Fish Oil 1000 MG Caps Take 2 g by mouth daily.   Levemir FlexTouch 100 UNIT/ML FlexPen Generic drug: insulin detemir Inject 8-44 Units into the skin See admin instructions. Inject 8 units subcutaneously in the morning and 37-44 units at night   losartan 25 MG tablet Commonly known as: COZAAR Take 25 mg by mouth daily.   magnesium oxide 400 MG tablet Commonly known as: MAG-OX Take 200 mg by mouth daily.   NovoLOG FlexPen 100 UNIT/ML FlexPen Generic drug: insulin aspart Inject 6-10 Units into the skin See admin instructions. Inject 6 units subcutaneously every morning, 6 units every afternoon, and 10 units with dinner plus sliding scale. Maximum daily dose 25 units.   VITAMIN C PO Take 1 tablet by mouth daily.           Outstanding Labs/Studies   Consider limited echo as an outpatient for reassessment of his EF.   Duration of Discharge Encounter   Greater than 30 minutes including physician time.  Signed, Ellsworth Lennox, PA-C 03/10/2021, 2:11 PM

## 2021-03-10 NOTE — Progress Notes (Signed)
Patient Name: Donald Fitzpatrick   Patient Profile:   Donald Fitzpatrick is a 80 y.o. male with HTN, HLD, DM 2, CAD s/p LAD stent 2002, TIA, PAD, frequent PVCs and chronic mild right lower extremity edema presented with transient AMS and presyncope with hypotension 4/2 2021 Echo EF 50% 2021 Myoview EF 38% Inferior/inferolateral scar 4/22 myoview large scar without ischemia EF 7 %    SUBJECTIVE without chest pain or shortness of breath  BP normal   Past Medical History:  Diagnosis Date  . CAD (coronary artery disease)    h/o LAD stent in [place]  . DM II (diabetes mellitus, type II), controlled (HCC)   . Frequent PVCs   . Hyperlipidemia LDL goal <70   . Hypertension   . Leg edema, right    chronic mild LLE edema  . PAD (peripheral artery disease) (HCC)   . Thoracic aortic aneurysm (TAA) (HCC)    4cm on previous echo    Scheduled Meds:  Scheduled Meds: . aspirin EC  81 mg Oral Daily  . atorvastatin  20 mg Oral Daily  . carvedilol  6.25 mg Oral BID  . [START ON 03/11/2021] clopidogrel  75 mg Oral QODAY  . heparin  5,000 Units Subcutaneous Q8H  . magnesium oxide  200 mg Oral Daily   Continuous Infusions: acetaminophen, nitroGLYCERIN, ondansetron (ZOFRAN) IV    PHYSICAL EXAM Vitals:   03/10/21 1039 03/10/21 1041 03/10/21 1043 03/10/21 1145  BP: (!) 192/90 (!) 113/50 (!) 129/51 (!) 128/48  Pulse:    65  Resp:    16  Temp:    97.9 F (36.6 C)  TempSrc:    Oral  SpO2:    99%  Weight:      Height:       Well developed and nourished in no acute distress HENT normal Neck supple with JVP-  flat   Clear Regular rate and rhythm, no murmurs or gallops Abd-soft with active BS No Clubbing cyanosis edema Skin-warm and dry A & Oriented  Grossly normal sensory and motor function     TELEMETRY: Reviewed personnally pt in sinus w PVCs:      Intake/Output Summary (Last 24 hours) at 03/10/2021 1310 Last data filed at 03/10/2021 0400 Gross per 24 hour  Intake 740 ml   Output 450 ml  Net 290 ml    LABS: Basic Metabolic Panel: Recent Labs  Lab 03/09/21 1142 03/09/21 2048 03/09/21 2317  NA 137  --  140  K 4.7  --  3.9  CL 105  --  106  CO2 23  --  29  GLUCOSE 141*  --  108*  BUN 34*  --  27*  CREATININE 1.35* 1.20 1.18  CALCIUM 8.8*  --  8.5*  MG 2.1  --   --    Cardiac Enzymes: No results for input(s): CKTOTAL, CKMB, CKMBINDEX, TROPONINI in the last 72 hours. CBC: Recent Labs  Lab 03/09/21 1142 03/09/21 2048  WBC 6.8 5.8  NEUTROABS 5.6  --   HGB 12.3* 12.1*  HCT 38.4* 37.1*  MCV 96.7 93.2  PLT 149* 158   PROTIME: No results for input(s): LABPROT, INR in the last 72 hours. Liver Function Tests: Recent Labs    03/09/21 1142  AST 22  ALT 23  ALKPHOS 45  BILITOT 1.0  PROT 5.9*  ALBUMIN 3.7   No results for input(s): LIPASE, AMYLASE in the last 72 hours. BNP: BNP (last 3 results) No  results for input(s): BNP in the last 8760 hours.  ProBNP (last 3 results) No results for input(s): PROBNP in the last 8760 hours.  D-Dimer: No results for input(s): DDIMER in the last 72 hours. Hemoglobin A1C: No results for input(s): HGBA1C in the last 72 hours. Fasting Lipid Panel: No results for input(s): CHOL, HDL, LDLCALC, TRIG, CHOLHDL, LDLDIRECT in the last 72 hours. Thyroid Function Tests: Recent Labs    03/09/21 1142  TSH 4.147   Anemia Panel: No results for input(s): VITAMINB12, FOLATE, FERRITIN, TIBC, IRON, RETICCTPCT in the last 72 hours.      ASSESSMENT AND PLAN:  Acute jaw and neck pain  Confusion associated with the above  Hypotension  PVCs-frequent with sinus bradycardia and functional bradycardia  Coronary artery disease with remote LAD stenting  TIA-previous   Myoview negative  Suspect nuerally mediated hypotension assoc with neck pain but not sure  Ok to discharge with followup with primary cardiology   Signed, Sherryl Manges MD  03/10/2021
